# Patient Record
Sex: Female | Born: 1965 | Race: Black or African American | Hispanic: No | Marital: Single | Smoking: Never smoker
Health system: Southern US, Community
[De-identification: ages and names within clinical notes are randomized; demographics above are authoritative.]

## PROBLEM LIST (undated history)

## (undated) ENCOUNTER — Inpatient Hospital Stay (HOSPITAL_COMMUNITY): Payer: Self-pay

---

## 2009-06-26 ENCOUNTER — Emergency Department (HOSPITAL_COMMUNITY): Admission: EM | Admit: 2009-06-26 | Discharge: 2009-06-26 | Payer: Self-pay | Admitting: Emergency Medicine

## 2010-09-13 ENCOUNTER — Emergency Department (HOSPITAL_COMMUNITY)
Admission: EM | Admit: 2010-09-13 | Discharge: 2010-09-13 | Payer: Self-pay | Source: Home / Self Care | Admitting: Emergency Medicine
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
## 2013-02-11 ENCOUNTER — Encounter (HOSPITAL_COMMUNITY): Payer: Self-pay

## 2013-02-11 ENCOUNTER — Inpatient Hospital Stay (HOSPITAL_COMMUNITY): Payer: Medicaid Other

## 2013-02-11 ENCOUNTER — Emergency Department (INDEPENDENT_AMBULATORY_CARE_PROVIDER_SITE_OTHER)
Admission: EM | Admit: 2013-02-11 | Discharge: 2013-02-11 | Disposition: A | Discharge status: Home / Self Care | Payer: Medicaid Other | Source: Home / Self Care

## 2013-02-11 ENCOUNTER — Inpatient Hospital Stay (HOSPITAL_COMMUNITY)
Admission: AD | Admit: 2013-02-11 | Discharge: 2013-02-11 | Disposition: A | Discharge status: Home / Self Care | Payer: Medicaid Other | Source: Ambulatory Visit | Attending: Family Medicine | Admitting: Family Medicine

## 2013-02-11 ENCOUNTER — Encounter (HOSPITAL_COMMUNITY): Payer: Self-pay | Admitting: Emergency Medicine

## 2013-02-11 DIAGNOSIS — R1031 Right lower quadrant pain: Secondary | ICD-10-CM

## 2013-02-11 DIAGNOSIS — O2390 Unspecified genitourinary tract infection in pregnancy, unspecified trimester: Secondary | ICD-10-CM | POA: Insufficient documentation

## 2013-02-11 DIAGNOSIS — N760 Acute vaginitis: Secondary | ICD-10-CM

## 2013-02-11 DIAGNOSIS — Z3201 Encounter for pregnancy test, result positive: Secondary | ICD-10-CM

## 2013-02-11 DIAGNOSIS — B9689 Other specified bacterial agents as the cause of diseases classified elsewhere: Secondary | ICD-10-CM | POA: Insufficient documentation

## 2013-02-11 DIAGNOSIS — A499 Bacterial infection, unspecified: Secondary | ICD-10-CM

## 2013-02-11 DIAGNOSIS — R52 Pain, unspecified: Secondary | ICD-10-CM

## 2013-02-11 LAB — HCG, QUANTITATIVE, PREGNANCY: hCG, Beta Chain, Quant, S: 62238 m[IU]/mL — ABNORMAL HIGH (ref <5)

## 2013-02-11 LAB — POCT URINALYSIS DIP (DEVICE)
Ketones, ur: NEGATIVE mg/dL
Protein, ur: 30 mg/dL — AB
Specific Gravity, Urine: >=1.03 (ref 1.005–1.030)
Urobilinogen, UA: 1 mg/dL (ref 0.0–1.0)

## 2013-02-11 LAB — CBC
Hemoglobin: 11.6 g/dL — ABNORMAL LOW (ref 12.0–15.0)
MCH: 25.6 pg — ABNORMAL LOW (ref 26.0–34.0)
RBC: 4.53 MIL/uL (ref 3.87–5.11)
RDW: 15.3 % (ref 11.5–15.5)
WBC: 8.2 10*3/uL (ref 4.0–10.5)

## 2013-02-11 LAB — ABO/RH: ABO/RH(D): AB POS

## 2013-02-11 LAB — WET PREP, GENITAL

## 2013-02-11 MED ORDER — METRONIDAZOLE 500 MG PO TABS: 500.0000 mg | ORAL_TABLET | Freq: Two times a day (BID) | ORAL | Status: AC

## 2013-02-11 NOTE — ED Provider Notes (Signed)
Medical screening examination/treatment/procedure(s) were performed by non-physician practitioner and as supervising physician I was immediately available for consultation/collaboration.  Leslee Home, M.D.  Reuben Likes, MD 02/11/13 [phone]

## 2013-02-11 NOTE — ED Notes (Signed)
Abdominal pain for 3 weeks ago.  Right lower abdomen.  Patient concerned for here recently adapted habit of drinking 5 your energy drinks.  Reports nausea

## 2013-02-11 NOTE — MAU Provider Note (Signed)
Chart reviewed and agree with management and plan.  

## 2013-02-11 NOTE — MAU Note (Signed)
Patient states she was seen at Urgent Care and sent to MAU for evaluation. Patient has been having abdominal pain, nausea for a while. Denies bleeding or discharge at this time.

## 2013-02-11 NOTE — MAU Provider Note (Signed)
History     CSN: [id number]  Arrival date and time: 02/11/13 1256   None     Chief Complaint  Patient presents with  . Abdominal Pain  . Possible Pregnancy   HPI  Pt is here after being seen in Urgent Care today for abdominal pain for 3 weeks. It started out as any nausea with an occasional vomiting episode. She has had a decrease in appetite. She describes the discomfort as an occasional sharp pain in the right lower quadrant it seems to be worse in the morning; they can occur at any time in a 24-hour period. The pain is located primarily in the right lower quadrant near the midline. The pain will tend to radiate and a small radius from the center point of tenderness. She denies fever or chills. It is associated with diminished appetite, nausea and some fatigue. Denies known fever, blood in the stool, abnormal stools, vaginal discharge, pain or genitourinary symptoms.    No past medical history on file.  No past surgical history on file.  No family history on file.  History  Substance Use Topics  . Smoking status: Never Smoker   . Smokeless tobacco: Not on file  . Alcohol Use: No    Allergies: No Known Allergies  No prescriptions prior to admission    Review of Systems  Constitutional: Positive for malaise/fatigue.  Gastrointestinal: Positive for nausea and abdominal pain.  All other systems reviewed and are negative.   Physical Exam   Blood pressure 110/82, pulse 86, temperature 98.6 F (37 C), temperature source Oral, resp. rate 16, height 5' 2.25" (1.581 m), weight 54.069 kg (119 lb 3.2 oz), last menstrual period 11/14/2012, SpO2 100.00%.  Physical Exam  Constitutional: She is oriented to person, place, and time. She appears well-developed and well-nourished. No distress.  HENT:  Head: Normocephalic.  Neck: Normal range of motion. Neck supple.  Cardiovascular: Normal rate, regular rhythm and normal heart sounds.  Exam reveals no gallop and no friction rub.     No murmur heard. Respiratory: Effort normal and breath sounds normal. No respiratory distress.  GI: Soft. There is no tenderness (midpelvic). There is no CVA tenderness.  Genitourinary: Uterus is enlarged. Cervix exhibits no motion tenderness and no discharge. Vaginal discharge (white, creamy) found.  Musculoskeletal: Normal range of motion.  Neurological: She is alert and oriented to person, place, and time.  Skin: Skin is warm and dry.  Psychiatric: She has a normal mood and affect.    MAU Course  Procedures  Results for orders placed during the hospital encounter of 02/11/13 (from the past 24 hour(s))  HCG, QUANTITATIVE, PREGNANCY     Status: Abnormal   Collection Time    02/11/13  1:36 PM      Result Value Range   hCG, Beta Nyra Jabs, [place] [address] (*) <5 mIU/mL  CBC     Status: Abnormal   Collection Time    02/11/13  1:36 PM      Result Value Range   WBC 8.2  4.0 - 10.5 K/uL   RBC 4.53  3.87 - 5.11 MIL/uL   Hemoglobin 11.6 (*) 12.0 - 15.0 g/dL   HCT 02.7 (*) 25.3 - 66.4 %   MCV 75.5 (*) 78.0 - 100.0 fL   MCH 25.6 (*) 26.0 - 34.0 pg   MCHC 33.9  30.0 - 36.0 g/dL   RDW 40.3  47.4 - 25.9 %   Platelets 235  150 - 400 K/uL  ABO/RH  Status: None   Collection Time    02/11/13  1:36 PM      Result Value Range   ABO/RH(D) AB POS    WET PREP, GENITAL     Status: Abnormal   Collection Time    02/11/13  4:10 PM      Result Value Range   Yeast Wet Prep HPF POC NONE SEEN  NONE SEEN   Trich, Wet Prep NONE SEEN  NONE SEEN   Clue Cells Wet Prep HPF POC MANY (*) NONE SEEN   WBC, Wet Prep HPF POC FEW (*) NONE SEEN    Ultrasound:   Assessment and Plan  Intrauterine Pregnancy 13.5 wks Advanced Maternal Age Bacterial Vaginosis  Plan: Please schedule Harmony Test ASAP - message sent to clinic RX Flagyl Pt uncertain regarding continuing pregnancy Begin prenatal vitamins and prenatal care if pregnancy continued  [facility] 02/11/2013, 3:10 PM

## 2013-02-11 NOTE — ED Provider Notes (Signed)
History     CSN: [id number]  Arrival date & time 02/11/13  1019   First MD Initiated Contact with Patient 02/11/13 1102      Chief Complaint  Patient presents with  . Abdominal Pain    (Consider location/radiation/quality/duration/timing/severity/associated sxs/prior treatment) HPI Comments: 47 year old female is complaining of abdominal pain for 3 weeks. It started out as any nausea with an occasional vomiting episode. She has had a decrease in appetite. She describes the discomfort as an occasional sharp pain in the right lower quadrant it seems to be worse in the morning; they can occur at any time in a 24-hour period. The pain is located primarily in the right lower quadrant near the midline. The pain will tend to radiate and a small radius from the center point of tenderness. She denies fever or chills. It is associated with diminished appetite, nausea and some fatigue. Denies known fever, blood in the stool, abnormal stools, vaginal discharge, pain or genitourinary symptoms. Surgical history includes C-section to the abdomen only.   History reviewed. No pertinent past medical history.  History reviewed. No pertinent past surgical history.  No family history on file.  History  Substance Use Topics  . Smoking status: Never Smoker   . Smokeless tobacco: Not on file  . Alcohol Use: No    OB History   Grav Para Term Preterm Abortions TAB SAB Ect Mult Living                  Review of Systems  Constitutional: Positive for activity change. Negative for fever, chills and diaphoresis.  HENT: Negative.   Respiratory: Negative.   Cardiovascular: Negative.   Gastrointestinal: Positive for nausea, vomiting and abdominal pain. Negative for diarrhea, constipation, blood in stool, abdominal distention, anal bleeding and rectal pain.  Genitourinary: Negative.   Musculoskeletal: Negative.   Skin: Negative.   Neurological: Negative.   Psychiatric/Behavioral: Negative.      Allergies  Review of patient's allergies indicates no known allergies.  Home Medications  No current outpatient prescriptions on file.  BP 130/72  Pulse 92  Temp(Src) 98.2 F (36.8 C) (Other (Comment))  Resp 18  SpO2 98%  LMP 12/24/2012  Physical Exam  Nursing note and vitals reviewed. Constitutional: She is oriented to person, place, and time. She appears well-developed and well-nourished. She appears distressed.  Eyes: EOM are normal.  Neck: Normal range of motion. Neck supple.  Cardiovascular: Normal rate, regular rhythm and normal heart sounds.   Pulmonary/Chest: Effort normal and breath sounds normal. No respiratory distress.  Abdominal: Soft. She exhibits no distension and no mass. There is tenderness. There is no rebound and no guarding.  Primary source of tenderness is in the right lower quadrant just right of the midline. There is no other area of tenderness. Negative for Murphy sign.  Musculoskeletal: She exhibits no edema and no tenderness.  Neurological: She is alert and oriented to person, place, and time. She exhibits normal muscle tone.  Skin: Skin is warm. No rash noted.  Psychiatric: She has a normal mood and affect.    ED Course  Procedures (including critical care time)  Labs Reviewed  POCT URINALYSIS DIP (DEVICE) - Abnormal; Notable for the following:    Hgb urine dipstick TRACE (*)    Protein, ur 30 (*)    All other components within normal limits   No results found.  Results for orders placed during the hospital encounter of 02/11/13  POCT URINALYSIS DIP (DEVICE)  Result Value Range   Glucose, UA NEGATIVE  NEGATIVE mg/dL   Bilirubin Urine NEGATIVE  NEGATIVE   Ketones, ur NEGATIVE  NEGATIVE mg/dL   Specific Gravity, Urine >=1.030  1.005 - 1.030   Hgb urine dipstick TRACE (*) NEGATIVE   pH 6.0  5.0 - 8.0   Protein, ur 30 (*) NEGATIVE mg/dL   Urobilinogen, UA 1.0  0.0 - 1.0 mg/dL   Nitrite NEGATIVE  NEGATIVE   Leukocytes, UA NEGATIVE   NEGATIVE      1. Abdominal pain, acute, right lower quadrant   2. Positive urine pregnancy test       MDM   Patient will be discharged but [facility] She has a positive pregnancy test as well is right lower to mid abdominal tenderness. She is not having abdominal pain today or now. No nausea vomiting. Her vital signs are stable and within normal limits.  Differential diagnosis includes hydatidiform mole, and appendicitis.  Hayden Rasmussen, NP 02/11/13 1200  Hayden Rasmussen, NP 02/11/13 1210

## 2013-02-12 LAB — GC/CHLAMYDIA PROBE AMP
CT Probe RNA: NEGATIVE
GC Probe RNA: NEGATIVE

## 2013-02-17 ENCOUNTER — Encounter (HOSPITAL_COMMUNITY): Payer: Self-pay | Admitting: Family

## 2013-02-17 ENCOUNTER — Ambulatory Visit (HOSPITAL_COMMUNITY): Admission: RE | Admit: 2013-02-17 | Payer: Self-pay | Source: Ambulatory Visit

## 2013-12-17 ENCOUNTER — Encounter (HOSPITAL_COMMUNITY): Payer: Self-pay | Admitting: *Deleted

## 2014-07-18 ENCOUNTER — Encounter (HOSPITAL_COMMUNITY): Payer: Self-pay | Admitting: *Deleted

## 2015-07-20 ENCOUNTER — Emergency Department (HOSPITAL_COMMUNITY)
Admission: EM | Admit: 2015-07-20 | Discharge: 2015-07-20 | Discharge status: Against Medical Advice | Payer: Medicaid Other | Attending: Emergency Medicine | Admitting: Emergency Medicine

## 2015-07-20 ENCOUNTER — Encounter (HOSPITAL_COMMUNITY): Payer: Self-pay | Admitting: Emergency Medicine

## 2015-07-20 DIAGNOSIS — K0381 Cracked tooth: Secondary | ICD-10-CM | POA: Insufficient documentation

## 2015-07-20 DIAGNOSIS — K0889 Other specified disorders of teeth and supporting structures: Secondary | ICD-10-CM | POA: Insufficient documentation

## 2015-07-20 NOTE — ED Notes (Signed)
Pt has a toothache in right side of upper mouth. Pt has broken molar that she put something cold on and now is having a lot of pain. No swelling or redness noted.

## 2015-07-20 NOTE — ED Notes (Signed)
Patient called for room multiple times with no answer
